# Patient Record
Sex: Female | Born: 1970 | Race: White | Hispanic: No | Smoking: Current every day smoker
Health system: Southern US, Community
[De-identification: ages and names within clinical notes are randomized; demographics above are authoritative.]

---

## 2006-03-27 ENCOUNTER — Ambulatory Visit: Payer: Self-pay | Admitting: Family Medicine

## 2007-03-09 ENCOUNTER — Emergency Department: Payer: Self-pay | Admitting: General Practice

## 2007-03-14 ENCOUNTER — Other Ambulatory Visit: Payer: Self-pay

## 2007-03-14 ENCOUNTER — Emergency Department: Payer: Self-pay

## 2007-03-16 ENCOUNTER — Emergency Department: Payer: Self-pay | Admitting: Unknown Physician Specialty

## 2010-03-12 ENCOUNTER — Emergency Department: Payer: Self-pay | Admitting: Emergency Medicine

## 2010-08-08 ENCOUNTER — Ambulatory Visit: Payer: Self-pay | Admitting: Obstetrics & Gynecology

## 2012-01-02 ENCOUNTER — Emergency Department: Payer: Self-pay | Admitting: Unknown Physician Specialty

## 2013-02-01 ENCOUNTER — Emergency Department: Payer: Self-pay | Admitting: Emergency Medicine

## 2013-02-01 LAB — URINALYSIS, COMPLETE
Blood: NEGATIVE
Glucose,UR: NEGATIVE mg/dL (ref 0–75)
Leukocyte Esterase: NEGATIVE
Nitrite: NEGATIVE
Ph: 5 (ref 4.5–8.0)
Specific Gravity: 1.029 (ref 1.003–1.030)
Squamous Epithelial: 9

## 2013-02-01 LAB — CBC
MCH: 33 pg (ref 26.0–34.0)
MCV: 99 fL (ref 80–100)

## 2013-02-01 LAB — RAPID INFLUENZA A&B ANTIGENS

## 2013-02-01 LAB — COMPREHENSIVE METABOLIC PANEL
Albumin: 3.8 g/dL (ref 3.4–5.0)
Alkaline Phosphatase: 98 U/L (ref 50–136)
BUN: 8 mg/dL (ref 7–18)
Calcium, Total: 8.8 mg/dL (ref 8.5–10.1)
Chloride: 106 mmol/L (ref 98–107)
Creatinine: 0.7 mg/dL (ref 0.60–1.30)
EGFR (African American): >60
Osmolality: 275 (ref 275–301)
SGOT(AST): 37 U/L (ref 15–37)
Sodium: 138 mmol/L (ref 136–145)
Total Protein: 7.9 g/dL (ref 6.4–8.2)

## 2013-10-19 ENCOUNTER — Emergency Department: Payer: Self-pay | Admitting: Emergency Medicine

## 2013-10-19 LAB — URINALYSIS, COMPLETE
Bacteria: NONE SEEN
Bilirubin,UR: NEGATIVE
Glucose,UR: NEGATIVE mg/dL (ref 0–75)
Leukocyte Esterase: NEGATIVE
Nitrite: NEGATIVE
Ph: 5 (ref 4.5–8.0)
Protein: NEGATIVE
RBC,UR: 1 /HPF (ref 0–5)
WBC UR: 2 /HPF (ref 0–5)

## 2013-10-19 LAB — CBC WITH DIFFERENTIAL/PLATELET
Basophil #: 0.1 10*3/uL (ref 0.0–0.1)
Basophil %: 1.2 %
Eosinophil %: 1.3 %
HGB: 15 g/dL (ref 12.0–16.0)
Lymphocyte #: 3.3 10*3/uL (ref 1.0–3.6)
Lymphocyte %: 29.3 %
MCH: 33.3 pg (ref 26.0–34.0)
MCV: 95 fL (ref 80–100)
Monocyte #: 0.7 x10 3/mm (ref 0.2–0.9)
Neutrophil %: 62.2 %
Platelet: 329 10*3/uL (ref 150–440)
RDW: 13.6 % (ref 11.5–14.5)
WBC: 11.1 10*3/uL — ABNORMAL HIGH (ref 3.6–11.0)

## 2013-10-19 LAB — BASIC METABOLIC PANEL
BUN: 7 mg/dL (ref 7–18)
Chloride: 108 mmol/L — ABNORMAL HIGH (ref 98–107)
Co2: 23 mmol/L (ref 21–32)
Creatinine: 0.79 mg/dL (ref 0.60–1.30)
EGFR (African American): >60
Glucose: 92 mg/dL (ref 65–99)
Potassium: 3.4 mmol/L — ABNORMAL LOW (ref 3.5–5.1)
Sodium: 138 mmol/L (ref 136–145)

## 2014-04-15 ENCOUNTER — Emergency Department: Payer: Self-pay | Admitting: Emergency Medicine

## 2014-09-29 ENCOUNTER — Emergency Department: Payer: Self-pay | Admitting: Emergency Medicine

## 2014-10-03 LAB — WOUND AEROBIC CULTURE

## 2014-11-30 ENCOUNTER — Emergency Department: Payer: Self-pay | Admitting: Emergency Medicine

## 2014-11-30 LAB — URINALYSIS, COMPLETE
Bilirubin,UR: NEGATIVE
Blood: NEGATIVE
GLUCOSE, UR: NEGATIVE mg/dL (ref 0–75)
Ketone: NEGATIVE
Leukocyte Esterase: NEGATIVE
Nitrite: NEGATIVE
PROTEIN: NEGATIVE
Ph: 7 (ref 4.5–8.0)
RBC,UR: 2 /HPF (ref 0–5)
Specific Gravity: 1.02 (ref 1.003–1.030)
Squamous Epithelial: <1

## 2014-11-30 LAB — BASIC METABOLIC PANEL
ANION GAP: 8 (ref 7–16)
BUN: 7 mg/dL (ref 7–18)
CO2: 23 mmol/L (ref 21–32)
CREATININE: 0.98 mg/dL (ref 0.60–1.30)
Calcium, Total: 8.3 mg/dL — ABNORMAL LOW (ref 8.5–10.1)
Chloride: 105 mmol/L (ref 98–107)
EGFR (African American): >60
GLUCOSE: 106 mg/dL — AB (ref 65–99)
OSMOLALITY: 270 (ref 275–301)
Potassium: 3.1 mmol/L — ABNORMAL LOW (ref 3.5–5.1)
Sodium: 136 mmol/L (ref 136–145)

## 2014-11-30 LAB — CBC WITH DIFFERENTIAL/PLATELET
BASOS PCT: 1 %
Basophil #: 0.1 10*3/uL (ref 0.0–0.1)
EOS PCT: 1.4 %
Eosinophil #: 0.2 10*3/uL (ref 0.0–0.7)
HCT: 44.4 % (ref 35.0–47.0)
HGB: 14.6 g/dL (ref 12.0–16.0)
Lymphocyte #: 4 10*3/uL — ABNORMAL HIGH (ref 1.0–3.6)
Lymphocyte %: 33.3 %
MCH: 32.2 pg (ref 26.0–34.0)
MCHC: 32.9 g/dL (ref 32.0–36.0)
MCV: 98 fL (ref 80–100)
Monocyte #: 0.7 x10 3/mm (ref 0.2–0.9)
Monocyte %: 5.9 %
NEUTROS ABS: 7 10*3/uL — AB (ref 1.4–6.5)
NEUTROS PCT: 58.4 %
PLATELETS: 301 10*3/uL (ref 150–440)
RBC: 4.55 10*6/uL (ref 3.80–5.20)
RDW: 14.7 % — ABNORMAL HIGH (ref 11.5–14.5)
WBC: 12.1 10*3/uL — AB (ref 3.6–11.0)

## 2014-11-30 LAB — PREGNANCY, URINE: Pregnancy Test, Urine: NEGATIVE m[IU]/mL

## 2017-08-21 ENCOUNTER — Emergency Department
Admission: EM | Admit: 2017-08-21 | Discharge: 2017-08-21 | Disposition: A | Discharge status: Home / Self Care | Payer: BLUE CROSS/BLUE SHIELD | Attending: Emergency Medicine | Admitting: Emergency Medicine

## 2017-08-21 DIAGNOSIS — F17210 Nicotine dependence, cigarettes, uncomplicated: Secondary | ICD-10-CM | POA: Diagnosis not present

## 2017-08-21 DIAGNOSIS — R600 Localized edema: Secondary | ICD-10-CM | POA: Diagnosis present

## 2017-08-21 DIAGNOSIS — L03211 Cellulitis of face: Secondary | ICD-10-CM | POA: Diagnosis not present

## 2017-08-21 MED ORDER — CLINDAMYCIN HCL 300 MG PO CAPS: 300.0000 mg | ORAL_CAPSULE | Freq: Four times a day (QID) | ORAL | 0 refills | Status: AC

## 2017-08-21 MED ORDER — IBUPROFEN 800 MG PO TABS: 800.0000 mg | ORAL_TABLET | Freq: Three times a day (TID) | ORAL | 0 refills | Status: DC | PRN

## 2017-08-21 MED ORDER — IBUPROFEN 800 MG PO TABS
800.0000 mg | ORAL_TABLET | Freq: Once | ORAL | Status: AC
  Administered 2017-08-21: 800 mg via ORAL
  Filled 2017-08-21: qty 1

## 2017-08-21 MED ORDER — IBUPROFEN 800 MG PO TABS: 800.0000 mg | ORAL_TABLET | Freq: Once | ORAL | Status: DC

## 2017-08-21 MED ORDER — CLINDAMYCIN PHOSPHATE 300 MG/2ML IJ SOLN
INTRAMUSCULAR | Status: AC
  Filled 2017-08-21: qty 4

## 2017-08-21 MED ORDER — CLINDAMYCIN PHOSPHATE 600 MG/4ML IJ SOLN
600.0000 mg | Freq: Once | INTRAMUSCULAR | Status: AC
  Administered 2017-08-21: 600 mg via INTRAMUSCULAR
  Filled 2017-08-21: qty 4

## 2017-08-21 NOTE — ED Triage Notes (Signed)
Patient c/o abscess to right lower face.

## 2017-08-21 NOTE — Discharge Instructions (Signed)
Take prescription as directed. Monitor the infection area for signs of improvement. If you note worsening signs of infection do not hesitate to return to the emergency department.

## 2017-08-21 NOTE — ED Provider Notes (Signed)
[facility] Emergency Department Provider Note   ____________________________________________   I have reviewed the triage vital signs and the nursing notes.   HISTORY  Chief Complaint Abscess    HPI Faith Price is a 46 y.o. female presents to the emergency department with a skin infection along the right lower jawline that developed yesterday. Patient noted the area started as what she thought was a pimple and she attempted to pop it. Patient noted progressing swelling through the day with increasing pain. Patient denies dental pain associated with soft tissue swelling and pain. Patient denies fever, chills, headache, vision changes, chest pain, chest tightness, shortness of breath, abdominal pain, nausea and vomiting.  History reviewed. No pertinent past medical history.  There are no active problems to display for this patient.   No past surgical history on file.  Prior to Admission medications   Medication Sig Start Date End Date Taking? Authorizing Provider  Citalopram Hydrobromide (CELEXA PO) Take by mouth.   Yes [provider]  clindamycin (CLEOCIN) 300 MG capsule Take 1 capsule (300 mg total) by mouth 4 (four) times daily. 08/21/17 08/31/17  Zury Fazzino M, PA-C  ibuprofen (ADVIL,MOTRIN) 800 MG tablet Take 1 tablet (800 mg total) by mouth every 8 (eight) hours as needed for moderate pain. 08/21/17   Joselyn Edling M, PA-C    Allergies Patient has no known allergies.  No family history on file.  Social History Social History  Substance Use Topics  . Smoking status: Current Every Day Smoker    Packs/day: 2.00    Types: Cigarettes  . Smokeless tobacco: Never Used  . Alcohol use No    Review of Systems Constitutional: Negative for fever/chills Eyes: No visual changes. ENT:  Negative for sore throat and for difficulty swallowing Cardiovascular: Denies chest pain. Respiratory: Denies cough. Denies shortness of  breath. Gastrointestinal: No abdominal pain.  No nausea, vomiting, diarrhea. Genitourinary: Negative for dysuria. Musculoskeletal: Negative for back pain. Skin: Negative for rash. Right lower jaw swelling, erythema and pain. Neurological: Negative for headaches.   ____________________________________________   PHYSICAL EXAM:  VITAL SIGNS: ED Triage Vitals  Enc Vitals Group     BP 08/21/17 1934 136/83     Pulse Rate 08/21/17 1933 76     Resp 08/21/17 1933 17     Temp 08/21/17 1933 97.7 F (36.5 C)     Temp Source 08/21/17 1933 Oral     SpO2 08/21/17 1933 100 %     Weight 08/21/17 1933 150 lb (68 kg)     Height 08/21/17 1933 5' 6.5" (1.689 m)     Head Circumference --      Peak Flow --      Pain Score 08/21/17 1932 7     Pain Loc --      Pain Edu? --      Excl. in GC? --     Constitutional: Alert and oriented. Well appearing and in no acute distress.  Eyes: Conjunctivae are normal. PERRL. EOMI  Head: Normocephalic and atraumatic. ENT:      Ears: Canals clear. TMs intact bilaterally.      Nose: No congestion/rhinnorhea.      Mouth/Throat: Mucous membranes are moist. Oropharynx Nonerythematous or edematous. Tonsils bilaterally symmetrical. Uvula midline. Neck:Supple. No thyromegaly. No stridor.  Cardiovascular: Normal rate, regular rhythm. Normal S1 and S2.  Good peripheral circulation. Respiratory: Normal respiratory effort without tachypnea or retractions. Lungs CTAB. No wheezes/rales/rhonchi.  Hematological/Lymphatic/Immunological: No cervical lymphadenopathy. Cardiovascular: Normal rate,  regular rhythm. Normal distal pulses. Gastrointestinal: Bowel sounds 4 quadrants. Soft and nontender to palpation. No guarding or rigidity. No palpable masses. No distention. No CVA tenderness. Musculoskeletal: Nontender with normal range of motion in all extremities. Neurologic: Normal speech and language. Skin:  Skin is warm, dry and intact. No rash noted. Cellulitis along the right  jawline with erythema, induration without area of fluctuance approximate size 3.5 x 3.5 cm. Psychiatric: Mood and affect are normal. Speech and behavior are normal. Patient exhibits appropriate insight and judgement.  ____________________________________________   LABS (all labs ordered are listed, but only abnormal results are displayed)  Labs Reviewed - No data to display ____________________________________________  EKG None ____________________________________________  RADIOLOGY None ____________________________________________   PROCEDURES  Procedure(s) performed: No    Critical Care performed: no ____________________________________________   INITIAL IMPRESSION / ASSESSMENT AND PLAN / ED COURSE  Pertinent labs & imaging results that were available during my care of the patient were reviewed by me and considered in my medical decision making (see chart for details).  Patient presents to emergency department with area along right lower jawline with erythema, swelling and pain that developed yesterday.Marland Kitchen History and physical exam findings are reassuring symptoms are consistent with cellulitis. There is no area of fluctuance therefore no incision and drainage required at this time. Antibody except initiated during the course of care the emergency department, clindamycin 600 mg IM. Patient will be prescribed a course of clindamycin and ibuprofen for pain and inflammation. Recommended patient continue to monitor the area for signs of worsening infection. If she noted worsening infection do not hesitate to return to the emergency department otherwise follow-up with her PCP as needed. Patient informed of clinical course, understand medical decision-making process, and agree with plan.   ____________________________________________   FINAL CLINICAL IMPRESSION(S) / ED DIAGNOSES  Final diagnoses:  Cellulitis, face       NEW MEDICATIONS STARTED DURING THIS  VISIT:  Discharge Medication List as of 08/21/2017  8:13 PM    START taking these medications   Details  clindamycin (CLEOCIN) 300 MG capsule Take 1 capsule (300 mg total) by mouth 4 (four) times daily., Starting Thu 08/21/2017, Until Sun 08/31/2017, Print    ibuprofen (ADVIL,MOTRIN) 800 MG tablet Take 1 tablet (800 mg total) by mouth every 8 (eight) hours as needed for moderate pain., Starting Thu 08/21/2017, Print         Note:  This document was prepared using Dragon voice recognition software and may include unintentional dictation errors.    Bertil Brickey, Karl Pock 08/21/17 2116    Minna Antis, MD 08/21/17 [phone]

## 2017-10-13 ENCOUNTER — Encounter: Payer: Self-pay | Admitting: Emergency Medicine

## 2017-10-13 DIAGNOSIS — R112 Nausea with vomiting, unspecified: Secondary | ICD-10-CM | POA: Insufficient documentation

## 2017-10-13 DIAGNOSIS — R1030 Lower abdominal pain, unspecified: Secondary | ICD-10-CM | POA: Diagnosis present

## 2017-10-13 DIAGNOSIS — F17210 Nicotine dependence, cigarettes, uncomplicated: Secondary | ICD-10-CM | POA: Insufficient documentation

## 2017-10-13 DIAGNOSIS — K529 Noninfective gastroenteritis and colitis, unspecified: Secondary | ICD-10-CM | POA: Diagnosis not present

## 2017-10-13 DIAGNOSIS — R509 Fever, unspecified: Secondary | ICD-10-CM | POA: Insufficient documentation

## 2017-10-13 LAB — COMPREHENSIVE METABOLIC PANEL
ALK PHOS: 60 U/L (ref 38–126)
ALT: 5 U/L — ABNORMAL LOW (ref 14–54)
ANION GAP: 7 (ref 5–15)
AST: 14 U/L — ABNORMAL LOW (ref 15–41)
Albumin: 3.7 g/dL (ref 3.5–5.0)
BILIRUBIN TOTAL: 0.4 mg/dL (ref 0.3–1.2)
BUN: 6 mg/dL (ref 6–20)
CALCIUM: 9 mg/dL (ref 8.9–10.3)
CO2: 26 mmol/L (ref 22–32)
Chloride: 105 mmol/L (ref 101–111)
Creatinine, Ser: 0.78 mg/dL (ref 0.44–1.00)
GFR calc non Af Amer: >60 mL/min (ref >60)
Glucose, Bld: 108 mg/dL — ABNORMAL HIGH (ref 65–99)
Potassium: 3.6 mmol/L (ref 3.5–5.1)
SODIUM: 138 mmol/L (ref 135–145)
TOTAL PROTEIN: 6.9 g/dL (ref 6.5–8.1)

## 2017-10-13 LAB — LIPASE, BLOOD: Lipase: 20 U/L (ref 11–51)

## 2017-10-13 LAB — CBC
HCT: 43.9 % (ref 35.0–47.0)
HEMOGLOBIN: 14.8 g/dL (ref 12.0–16.0)
MCH: 32.6 pg (ref 26.0–34.0)
MCHC: 33.8 g/dL (ref 32.0–36.0)
MCV: 96.4 fL (ref 80.0–100.0)
Platelets: 298 10*3/uL (ref 150–440)
RBC: 4.55 MIL/uL (ref 3.80–5.20)
RDW: 13.5 % (ref 11.5–14.5)
WBC: 15.6 10*3/uL — ABNORMAL HIGH (ref 3.6–11.0)

## 2017-10-13 NOTE — ED Triage Notes (Signed)
Pt c/o diarrhea, N/V x1 week. Pt denies recent antibiotic usage. Per pt, shje has not been able to eat nor drink due to the vomiting. Pts VS WNL in triage, steady gait seen as pt ambulates.

## 2017-10-14 ENCOUNTER — Emergency Department: Payer: BLUE CROSS/BLUE SHIELD

## 2017-10-14 ENCOUNTER — Encounter: Payer: Self-pay | Admitting: Radiology

## 2017-10-14 ENCOUNTER — Emergency Department
Admission: EM | Admit: 2017-10-14 | Discharge: 2017-10-14 | Disposition: A | Discharge status: Home / Self Care | Payer: BLUE CROSS/BLUE SHIELD | Attending: Emergency Medicine | Admitting: Emergency Medicine

## 2017-10-14 DIAGNOSIS — R112 Nausea with vomiting, unspecified: Secondary | ICD-10-CM

## 2017-10-14 DIAGNOSIS — R1030 Lower abdominal pain, unspecified: Secondary | ICD-10-CM

## 2017-10-14 DIAGNOSIS — R197 Diarrhea, unspecified: Secondary | ICD-10-CM

## 2017-10-14 DIAGNOSIS — K529 Noninfective gastroenteritis and colitis, unspecified: Secondary | ICD-10-CM

## 2017-10-14 LAB — URINALYSIS, COMPLETE (UACMP) WITH MICROSCOPIC
BILIRUBIN URINE: NEGATIVE
Glucose, UA: NEGATIVE mg/dL
Hgb urine dipstick: NEGATIVE
KETONES UR: NEGATIVE mg/dL
LEUKOCYTES UA: NEGATIVE
Nitrite: NEGATIVE
PH: 6 (ref 5.0–8.0)
Protein, ur: NEGATIVE mg/dL
Specific Gravity, Urine: 1.018 (ref 1.005–1.030)

## 2017-10-14 LAB — PREGNANCY, URINE: Preg Test, Ur: NEGATIVE

## 2017-10-14 MED ORDER — SODIUM CHLORIDE 0.9 % IV BOLUS (SEPSIS)
1000.0000 mL | Freq: Once | INTRAVENOUS | Status: AC
  Administered 2017-10-14: 1000 mL via INTRAVENOUS

## 2017-10-14 MED ORDER — ACETAMINOPHEN 325 MG PO TABS
ORAL_TABLET | ORAL | Status: AC
  Filled 2017-10-14: qty 2

## 2017-10-14 MED ORDER — ACETAMINOPHEN 325 MG PO TABS
650.0000 mg | ORAL_TABLET | Freq: Once | ORAL | Status: AC
  Administered 2017-10-14: 650 mg via ORAL

## 2017-10-14 MED ORDER — METRONIDAZOLE 500 MG PO TABS
500.0000 mg | ORAL_TABLET | Freq: Once | ORAL | Status: AC
  Administered 2017-10-14: 500 mg via ORAL
  Filled 2017-10-14: qty 1

## 2017-10-14 MED ORDER — METRONIDAZOLE 500 MG PO TABS: 500.0000 mg | ORAL_TABLET | Freq: Two times a day (BID) | ORAL | 0 refills | Status: DC

## 2017-10-14 MED ORDER — IOPAMIDOL (ISOVUE-300) INJECTION 61%
100.0000 mL | Freq: Once | INTRAVENOUS | Status: AC | PRN
  Administered 2017-10-14: 100 mL via INTRAVENOUS

## 2017-10-14 MED ORDER — CIPROFLOXACIN IN D5W 400 MG/200ML IV SOLN
400.0000 mg | Freq: Once | INTRAVENOUS | Status: AC
  Administered 2017-10-14: 400 mg via INTRAVENOUS
  Filled 2017-10-14: qty 200

## 2017-10-14 MED ORDER — CIPROFLOXACIN HCL 500 MG PO TABS: 500.0000 mg | ORAL_TABLET | Freq: Two times a day (BID) | ORAL | 0 refills | Status: DC

## 2017-10-14 MED ORDER — ONDANSETRON 4 MG PO TBDP: 4.0000 mg | ORAL_TABLET | Freq: Three times a day (TID) | ORAL | 0 refills | Status: DC | PRN

## 2017-10-14 MED ORDER — MORPHINE SULFATE (PF) 4 MG/ML IV SOLN
4.0000 mg | Freq: Once | INTRAVENOUS | Status: AC
  Administered 2017-10-14: 4 mg via INTRAVENOUS
  Filled 2017-10-14: qty 1

## 2017-10-14 MED ORDER — ONDANSETRON HCL 4 MG/2ML IJ SOLN
4.0000 mg | Freq: Once | INTRAMUSCULAR | Status: AC
  Administered 2017-10-14: 4 mg via INTRAVENOUS
  Filled 2017-10-14: qty 2

## 2017-10-14 NOTE — ED Provider Notes (Signed)
[facility] Emergency Department Provider Note   ____________________________________________   First MD Initiated Contact with Patient 10/14/17 0101     (approximate)  I have reviewed the triage vital signs and the nursing notes.   HISTORY  Chief Complaint Diarrhea; Emesis; and Fever    HPI Faith Price is a 46 y.o. female who comes into the hospital today with some abdominal pain, fever, nausea, vomiting and diarrhea. The patient is also had some back pain. She reports that she's been having diarrhea for the past week. Her other symptoms started Saturday. She's had some diarrhea that she has been unable to make it to the bathroom the last 2 times. The patient denies any sick contacts. She reports that her emesis has been clear and about 3 times a day. The patient denies recent antibiotics as well. She has some lower abdominal pain as well as pain with urination. The patient rates her pain 8 out of 10 in intensity. The patient reports that the pain is shooting and that her diarrhea comes every time she eats. The patient was feeling so unwell that her friend brought her in for further evaluation of her symptoms.   History reviewed. No pertinent past medical history.  There are no active problems to display for this patient.   History reviewed. No pertinent surgical history.  Prior to Admission medications   Medication Sig Start Date End Date Taking? Authorizing Provider  ciprofloxacin (CIPRO) 500 MG tablet Take 1 tablet (500 mg total) by mouth 2 (two) times daily. 10/14/17 10/21/17  Rebecka Apley, MD  Citalopram Hydrobromide (CELEXA PO) Take by mouth.    [provider]  ibuprofen (ADVIL,MOTRIN) 800 MG tablet Take 1 tablet (800 mg total) by mouth every 8 (eight) hours as needed for moderate pain. 08/21/17   Little, Traci M, PA-C  metroNIDAZOLE (FLAGYL) 500 MG tablet Take 1 tablet (500 mg total) by mouth 2 (two) times daily. 10/14/17 10/21/17   Rebecka Apley, MD  ondansetron (ZOFRAN ODT) 4 MG disintegrating tablet Take 1 tablet (4 mg total) by mouth every 8 (eight) hours as needed for nausea or vomiting. 10/14/17   Rebecka Apley, MD    Allergies Patient has no known allergies.  History reviewed. No pertinent family history.  Social History Social History  Substance Use Topics  . Smoking status: Current Every Day Smoker    Packs/day: 2.00    Types: Cigarettes  . Smokeless tobacco: Never Used  . Alcohol use No    Review of Systems  Constitutional:  fever/chills Eyes: No visual changes. ENT: No sore throat. Cardiovascular: Denies chest pain. Respiratory: Denies shortness of breath. Gastrointestinal: abdominal pain.   nausea, vomiting.   diarrhea.  No constipation. Genitourinary: Negative for dysuria. Musculoskeletal: back pain. Skin: Negative for rash. Neurological: Negative for headaches, focal weakness or numbness.   ____________________________________________   PHYSICAL EXAM:  VITAL SIGNS: ED Triage Vitals  Enc Vitals Group     BP 10/13/17 2205 (!) 108/92     Pulse Rate 10/13/17 2205 98     Resp 10/13/17 2205 18     Temp 10/13/17 2205 99.2 F (37.3 C)     Temp Source 10/13/17 2205 Oral     SpO2 10/13/17 2205 100 %     Weight 10/13/17 2205 150 lb (68 kg)     Height 10/13/17 2205  (1.676 m)     Head Circumference --      Peak Flow --  Pain Score 10/14/17 0104 8     Pain Loc --      Pain Edu? --      Excl. in GC? --     Constitutional: Alert and oriented. Well appearing and in moderate distress. Eyes: Conjunctivae are normal. PERRL. EOMI. Head: Atraumatic. Nose: No congestion/rhinnorhea. Mouth/Throat: Mucous membranes are moist.  Oropharynx non-erythematous. Cardiovascular: Normal rate, regular rhythm. Grossly normal heart sounds.  Good peripheral circulation. Respiratory: Normal respiratory effort.  No retractions. Lungs CTAB. Gastrointestinal: Soft with some lower  abdominal tenderness to palpation No distention. positive bowel sounds Musculoskeletal: No lower extremity tenderness nor edema.   Neurologic:  Normal speech and language.  Skin:  Skin is warm, dry and intact.  Psychiatric: Mood and affect are normal.   ____________________________________________   LABS (all labs ordered are listed, but only abnormal results are displayed)  Labs Reviewed  COMPREHENSIVE METABOLIC PANEL - Abnormal; Notable for the following:       Result Value   Glucose, Bld 108 (*)    AST 14 (*)    ALT 5 (*)    All other components within normal limits  CBC - Abnormal; Notable for the following:    WBC 15.6 (*)    All other components within normal limits  URINALYSIS, COMPLETE (UACMP) WITH MICROSCOPIC - Abnormal; Notable for the following:    Color, Urine YELLOW (*)    APPearance CLEAR (*)    Bacteria, UA RARE (*)    Squamous Epithelial / LPF 0-5 (*)    All other components within normal limits  LIPASE, BLOOD  PREGNANCY, URINE   ____________________________________________  EKG  none ____________________________________________  RADIOLOGY  Ct Abdomen Pelvis W Contrast  Result Date: 10/14/2017 CLINICAL DATA:  Diarrhea x1 week with nausea vomiting EXAM: CT ABDOMEN AND PELVIS WITH CONTRAST TECHNIQUE: Multidetector CT imaging of the abdomen and pelvis was performed using the standard protocol following bolus administration of intravenous contrast. CONTRAST:  ISOVUE-300 IOPAMIDOL (ISOVUE-300) INJECTION 61% COMPARISON:  Chest CT 03/14/2007 FINDINGS: Lower chest: Normal heart size. Minimal atelectasis at each lung base. No pericardial effusion. Hepatobiliary: 11 mm right hepatic hypervascular focus, nonspecific but could represent flash filling of a hemangioma. Normal gallbladder. No biliary dilatation. Pancreas: Normal pancreas. No inflammation or ductal dilatation. No focal mass. Spleen: Normal in size without focal abnormality. Adrenals/Urinary Tract:  Adrenal glands are unremarkable. Kidneys are normal, without renal calculi, focal lesion, or hydronephrosis. Bladder is unremarkable. Stomach/Bowel: Physiologic distention of the stomach with normal small bowel rotation. No mechanical obstruction. Mild transmural thickening of the colon from cecum through distal sigmoid consistent with a postinfectious or postinflammatory colitis. Normal appendix. Vascular/Lymphatic: Minimal aortic atherosclerosis. No lymphadenopathy. Reproductive: Uterus and bilateral adnexa are unremarkable. Other: Small umbilical fat containing hernia. No abdominopelvic ascites. Musculoskeletal: No acute or significant osseous findings. IMPRESSION: 1. Diffuse mild transmural thickening of the colon consistent with colitis. 2. Tiny right hepatic 11 mm hypervascular lesion, nonspecific but could represent flash filling of a small hemangioma. 3. Small fat containing umbilical hernia. Electronically Signed   By: Tollie Eth M.D.   On: 10/14/2017 03:39    ____________________________________________   PROCEDURES  Procedure(s) performed: None  Procedures  Critical Care performed: No  ____________________________________________   INITIAL IMPRESSION / ASSESSMENT AND PLAN / ED COURSE  As part of my medical decision making, I reviewed the following data within the electronic MEDICAL RECORD NUMBER Notes from prior ED visits and [place] Controlled Substance Database  this is a 46 year old female who comes into  the hospital today with abdominal pain fever nausea vomiting and diarrhea.  The differential diagnosis includes gastroenteritis, colitis, diverticulitisurinary tract infection.  I did check some blood work on the patient. Her lip also count was 15.6 but her remaining CBC was unremarkable. The patient's CMP was also unremarkable as was her urine. I gave the patient liter of normal saline and some morphine and Zofran. I sent the patient for CT scan and it was found that the patient had  some colitis. the patient received a dose of ciprofloxacin and Flagyl. She was able to take some oral medication without any vomiting as well as eat some crackers. The patient's pain was also improved. She'll be discharged home to follow-up with her primary care physician or the acute care clinic. The patient has no further questions or concerns.       ____________________________________________   FINAL CLINICAL IMPRESSION(S) / ED DIAGNOSES  Final diagnoses:  Nausea vomiting and diarrhea  Colitis  Lower abdominal pain      NEW MEDICATIONS STARTED DURING THIS VISIT:  Discharge Medication List as of 10/14/2017  5:03 AM    START taking these medications   Details  ciprofloxacin (CIPRO) 500 MG tablet Take 1 tablet (500 mg total) by mouth 2 (two) times daily., Starting Tue 10/14/2017, Until Tue 10/21/2017, Print    metroNIDAZOLE (FLAGYL) 500 MG tablet Take 1 tablet (500 mg total) by mouth 2 (two) times daily., Starting Tue 10/14/2017, Until Tue 10/21/2017, Print    ondansetron (ZOFRAN ODT) 4 MG disintegrating tablet Take 1 tablet (4 mg total) by mouth every 8 (eight) hours as needed for nausea or vomiting., Starting Tue 10/14/2017, Print         Note:  This document was prepared using Dragon voice recognition software and may include unintentional dictation errors.    Rebecka Apley, MD 10/14/17 [id number]

## 2017-10-14 NOTE — ED Notes (Signed)
ED Provider at bedside. 

## 2017-10-14 NOTE — Discharge Instructions (Signed)
PLease follow up with the acute care clinic

## 2017-10-14 NOTE — ED Notes (Addendum)
Pt states watery diarrhea x1 week, vomiting clear liquid and abd pain x2 days. Pt states unable to tolerate PO

## 2017-10-15 ENCOUNTER — Encounter: Payer: Self-pay | Admitting: Emergency Medicine

## 2017-10-15 ENCOUNTER — Emergency Department
Admission: EM | Admit: 2017-10-15 | Discharge: 2017-10-15 | Disposition: A | Discharge status: Home / Self Care | Payer: BLUE CROSS/BLUE SHIELD | Attending: Emergency Medicine | Admitting: Emergency Medicine

## 2017-10-15 DIAGNOSIS — A0472 Enterocolitis due to Clostridium difficile, not specified as recurrent: Secondary | ICD-10-CM | POA: Diagnosis not present

## 2017-10-15 DIAGNOSIS — Z79899 Other long term (current) drug therapy: Secondary | ICD-10-CM | POA: Diagnosis not present

## 2017-10-15 DIAGNOSIS — F17210 Nicotine dependence, cigarettes, uncomplicated: Secondary | ICD-10-CM | POA: Insufficient documentation

## 2017-10-15 DIAGNOSIS — R197 Diarrhea, unspecified: Secondary | ICD-10-CM | POA: Diagnosis present

## 2017-10-15 LAB — URINALYSIS, COMPLETE (UACMP) WITH MICROSCOPIC
BACTERIA UA: NONE SEEN
BILIRUBIN URINE: NEGATIVE
Glucose, UA: NEGATIVE mg/dL
Hgb urine dipstick: NEGATIVE
KETONES UR: NEGATIVE mg/dL
Nitrite: NEGATIVE
Protein, ur: NEGATIVE mg/dL
RBC / HPF: NONE SEEN RBC/hpf (ref 0–5)
Specific Gravity, Urine: 1.016 (ref 1.005–1.030)
pH: 5 (ref 5.0–8.0)

## 2017-10-15 LAB — C DIFFICILE QUICK SCREEN W PCR REFLEX
C DIFFICILE (CDIFF) TOXIN: POSITIVE — AB
C DIFFICLE (CDIFF) ANTIGEN: POSITIVE — AB
C Diff interpretation: DETECTED

## 2017-10-15 LAB — CBC
HEMATOCRIT: 43.1 % (ref 35.0–47.0)
Hemoglobin: 14.7 g/dL (ref 12.0–16.0)
MCH: 32.5 pg (ref 26.0–34.0)
MCHC: 34 g/dL (ref 32.0–36.0)
MCV: 95.5 fL (ref 80.0–100.0)
Platelets: 322 10*3/uL (ref 150–440)
RBC: 4.51 MIL/uL (ref 3.80–5.20)
RDW: 13.4 % (ref 11.5–14.5)
WBC: 14.2 10*3/uL — AB (ref 3.6–11.0)

## 2017-10-15 LAB — COMPREHENSIVE METABOLIC PANEL
ALT: 7 U/L — ABNORMAL LOW (ref 14–54)
AST: 16 U/L (ref 15–41)
Albumin: 3.6 g/dL (ref 3.5–5.0)
Alkaline Phosphatase: 62 U/L (ref 38–126)
Anion gap: 10 (ref 5–15)
BUN: 8 mg/dL (ref 6–20)
CHLORIDE: 102 mmol/L (ref 101–111)
CO2: 26 mmol/L (ref 22–32)
Calcium: 8.8 mg/dL — ABNORMAL LOW (ref 8.9–10.3)
Creatinine, Ser: 0.86 mg/dL (ref 0.44–1.00)
Glucose, Bld: 100 mg/dL — ABNORMAL HIGH (ref 65–99)
POTASSIUM: 3.4 mmol/L — AB (ref 3.5–5.1)
Sodium: 138 mmol/L (ref 135–145)
Total Bilirubin: 0.3 mg/dL (ref 0.3–1.2)
Total Protein: 6.9 g/dL (ref 6.5–8.1)

## 2017-10-15 LAB — LIPASE, BLOOD: LIPASE: 19 U/L (ref 11–51)

## 2017-10-15 MED ORDER — SODIUM CHLORIDE 0.9 % IV BOLUS (SEPSIS)
1000.0000 mL | Freq: Once | INTRAVENOUS | Status: AC
  Administered 2017-10-15: 1000 mL via INTRAVENOUS

## 2017-10-15 MED ORDER — SODIUM CHLORIDE 0.9 % IV BOLUS (SEPSIS)
1000.0000 mL | Freq: Once | INTRAVENOUS | Status: AC
Start: 2017-10-15 — End: 2017-10-15
  Administered 2017-10-15: 1000 mL via INTRAVENOUS

## 2017-10-15 MED ORDER — OXYCODONE-ACETAMINOPHEN 5-325 MG PO TABS: 1.0000 | ORAL_TABLET | Freq: Four times a day (QID) | ORAL | 0 refills | Status: AC | PRN

## 2017-10-15 MED ORDER — VANCOMYCIN 50 MG/ML ORAL SOLUTION
125.0000 mg | Freq: Four times a day (QID) | ORAL | Status: DC
  Administered 2017-10-15: 125 mg via ORAL
  Filled 2017-10-15 (×2): qty 2.5

## 2017-10-15 MED ORDER — KETOROLAC TROMETHAMINE 30 MG/ML IJ SOLN
15.0000 mg | Freq: Once | INTRAMUSCULAR | Status: AC
  Administered 2017-10-15: 15 mg via INTRAVENOUS
  Filled 2017-10-15: qty 1

## 2017-10-15 MED ORDER — ONDANSETRON HCL 4 MG/2ML IJ SOLN
INTRAMUSCULAR | Status: AC
  Administered 2017-10-15: 12:00:00
  Filled 2017-10-15: qty 2

## 2017-10-15 MED ORDER — VANCOMYCIN HCL 125 MG PO CAPS: 125.0000 mg | ORAL_CAPSULE | Freq: Four times a day (QID) | ORAL | 0 refills | Status: AC

## 2017-10-15 MED ORDER — ONDANSETRON HCL 4 MG/2ML IJ SOLN
4.0000 mg | Freq: Once | INTRAMUSCULAR | Status: AC
  Administered 2017-10-15: 4 mg via INTRAVENOUS

## 2017-10-15 MED ORDER — ACETAMINOPHEN 500 MG PO TABS
1000.0000 mg | ORAL_TABLET | Freq: Once | ORAL | Status: AC
Start: 2017-10-15 — End: 2017-10-15
  Administered 2017-10-15: 1000 mg via ORAL
  Filled 2017-10-15: qty 2

## 2017-10-15 NOTE — ED Notes (Signed)
ED Provider at bedside. 

## 2017-10-15 NOTE — ED Triage Notes (Signed)
Pt to ED via POV with c/o abd pain. Pt was seen on 10/16 and discharged with colitis and given flagyl and cipro rx. Pt states worsening pain and n/v/d.

## 2017-10-15 NOTE — ED Notes (Signed)
Spoke with MD Don PerkingVeronese regarding pt symptoms , verbal orders given for zofran and fluids at this time

## 2017-10-15 NOTE — ED Provider Notes (Signed)
[facility] Emergency Department Provider Note  ____________________________________________  Time seen: Approximately 11:57 AM  I have reviewed the triage vital signs and the nursing notes.   HISTORY  Chief Complaint Abdominal Pain   HPI Faith Price is a 46 y.o. female who was seen here 2 days ago and diagnosed with colitis on the CT scan who presents for evaluation of persistent abdominal pain, nausea, and diarrhea. Patient has been takingCipro and Flagyl. She reports that she continues to have several daily watery bowel movements and nausea. Not vomiting anymore. Has had chills and subjective fevers. Continues to endorse diffuse, cramping, moderate severe abdominal pain. No dysuria or hematuria. No chest pain or shortness of breath. No prior history of C. difficile. Patient reports that her pain is identical to the patient had 2 days ago when she underwent a CT scan.  History reviewed. No pertinent past medical history.  There are no active problems to display for this patient.   History reviewed. No pertinent surgical history.  Prior to Admission medications   Medication Sig Start Date End Date Taking? Authorizing Provider  Citalopram Hydrobromide (CELEXA PO) Take by mouth.    [provider]  ibuprofen (ADVIL,MOTRIN) 800 MG tablet Take 1 tablet (800 mg total) by mouth every 8 (eight) hours as needed for moderate pain. 08/21/17   Little, Traci M, PA-C  ondansetron (ZOFRAN ODT) 4 MG disintegrating tablet Take 1 tablet (4 mg total) by mouth every 8 (eight) hours as needed for nausea or vomiting. 10/14/17   Rebecka ApleyWebster, Allison P, MD  oxyCODONE-acetaminophen (ROXICET) 5-325 MG tablet Take 1 tablet by mouth every 6 (six) hours as needed. 10/15/17 10/15/18  Nita SickleVeronese, [place], MD  vancomycin (VANCOCIN) 125 MG capsule Take 1 capsule (125 mg total) by mouth 4 (four) times daily. 10/15/17 10/25/17  Nita SickleVeronese, [place], MD    Allergies Patient has no  known allergies.  No family history on file.  Social History Social History  Substance Use Topics  . Smoking status: Current Every Day Smoker    Packs/day: 2.00    Types: Cigarettes  . Smokeless tobacco: Never Used  . Alcohol use No    Review of Systems  Constitutional: + fever and chills Eyes: Negative for visual changes. ENT: Negative for sore throat. Neck: No neck pain  Cardiovascular: Negative for chest pain. Respiratory: Negative for shortness of breath. Gastrointestinal: + diffuse abdominal pain, nausea, and diarrhea. No vomiting  Genitourinary: Negative for dysuria. Musculoskeletal: Negative for back pain. Skin: Negative for rash. Neurological: Negative for headaches, weakness or numbness. Psych: No SI or HI  ____________________________________________   PHYSICAL EXAM:  VITAL SIGNS: ED Triage Vitals  Enc Vitals Group     BP 10/15/17 1030 (!) 118/94     Pulse Rate 10/15/17 1030 (!) 122     Resp 10/15/17 1030 18     Temp 10/15/17 1030 98.4 F (36.9 C)     Temp Source 10/15/17 1030 Oral     SpO2 10/15/17 1030 99 %     Weight 10/15/17 1031 150 lb (68 kg)     Height 10/15/17 1031 5\' 6"  (1.676 m)     Head Circumference --      Peak Flow --      Pain Score 10/15/17 1048 10     Pain Loc --      Pain Edu? --      Excl. in GC? --     Constitutional: Alert and oriented. Well appearing and in no  apparent distress. HEENT:      Head: Normocephalic and atraumatic.         Eyes: Conjunctivae are normal. Sclera is non-icteric.       Mouth/Throat: Mucous membranes are dry.       Neck: Supple with no signs of meningismus. Cardiovascular: Tachycardic with regular rhythm. No murmurs, gallops, or rubs. 2+ symmetrical distal pulses are present in all extremities. No JVD. Respiratory: Normal respiratory effort. Lungs are clear to auscultation bilaterally. No wheezes, crackles, or rhonchi.  Gastrointestinal: Soft, mildly diffuse tender to palpation, and non distended with  positive bowel sounds. No rebound or guarding. Musculoskeletal: Nontender with normal range of motion in all extremities. No edema, cyanosis, or erythema of extremities. Neurologic: Normal speech and language. Face is symmetric. Moving all extremities. No gross focal neurologic deficits are appreciated. Skin: Skin is warm, dry and intact. No rash noted. Psychiatric: Mood and affect are normal. Speech and behavior are normal.  ____________________________________________   LABS (all labs ordered are listed, but only abnormal results are displayed)  Labs Reviewed  C DIFFICILE QUICK SCREEN W PCR REFLEX - Abnormal; Notable for the following:       Result Value   C Diff antigen POSITIVE (*)    C Diff toxin POSITIVE (*)    All other components within normal limits  COMPREHENSIVE METABOLIC PANEL - Abnormal; Notable for the following:    Potassium 3.4 (*)    Glucose, Bld 100 (*)    Calcium 8.8 (*)    ALT 7 (*)    All other components within normal limits  CBC - Abnormal; Notable for the following:    WBC 14.2 (*)    All other components within normal limits  URINALYSIS, COMPLETE (UACMP) WITH MICROSCOPIC - Abnormal; Notable for the following:    Color, Urine YELLOW (*)    APPearance HAZY (*)    Leukocytes, UA TRACE (*)    Squamous Epithelial / LPF 6-30 (*)    All other components within normal limits  LIPASE, BLOOD  POC URINE PREG, ED   ____________________________________________  EKG  none  ____________________________________________  RADIOLOGY  none  ____________________________________________   PROCEDURES  Procedure(s) performed: None Procedures Critical Care performed:  None ____________________________________________   INITIAL IMPRESSION / ASSESSMENT AND PLAN / ED COURSE  46 y.o. female who was seen here 2 days ago and diagnosed with colitis on the CT scan who presents for evaluation of persistent abdominal pain, nausea, and diarrhea. patient is tachycardic  and looks dry on exam, her abdomen is soft with no rebound or guarding, normal bowel sounds, and diffuse mild tenderness throughout. Labs show improving leukocytosis, normal creatinine. UA is pending to evaluate for ketones or infection. We'll order a C. difficile. We'll give IV fluids, IV Zofran, Toradol and Tylenol and reassess. Presentation concerning for ongoing colitis and dehydration in the setting of similar symptoms and CT done 2 days ago. No localized RLQ ttp.    _________________________ 1:49 PM on 10/15/2017 -----------------------------------------  C. difficile positive in the emergency department. The patient's blood pressure and heart rate improved after 2 L of normal saline. She is tolerating by mouth. Patient only had one small bowel movement in the emergency department. We'll discontinue Cipro and Flagyl and switch patient to by mouth vancomycin. Discussed return precautions and recommended close follow-up with primary care doctor.   As part of my medical decision making, I reviewed the following data within the electronic MEDICAL RECORD NUMBER Nursing notes reviewed and incorporated, Labs reviewed ,  Old chart reviewed, Notes from prior ED visits and [place] Controlled Substance Database    Pertinent labs & imaging results that were available during my care of the patient were reviewed by me and considered in my medical decision making (see chart for details).    ____________________________________________   FINAL CLINICAL IMPRESSION(S) / ED DIAGNOSES  Final diagnoses:  C. difficile colitis      NEW MEDICATIONS STARTED DURING THIS VISIT:  New Prescriptions   OXYCODONE-ACETAMINOPHEN (ROXICET) 5-325 MG TABLET    Take 1 tablet by mouth every 6 (six) hours as needed.   VANCOMYCIN (VANCOCIN) 125 MG CAPSULE    Take 1 capsule (125 mg total) by mouth 4 (four) times daily.     Note:  This document was prepared using Dragon voice recognition software and may include unintentional  dictation errors.    Nita Sickle, MD 10/15/17 1352

## 2018-06-19 IMAGING — CT CT ABD-PELV W/ CM
2 of 5 series · 16 of 46 positions shown, 18 images · IV contrast (APPLIED)
Comparison: Chest CT 03/14/2007

CLINICAL DATA: Diarrhea x1 week with nausea vomiting

EXAM:
CT ABDOMEN AND PELVIS WITH CONTRAST
TECHNIQUE: Multidetector CT imaging of the abdomen and pelvis was performed
using the standard protocol following bolus administration of
intravenous contrast.
CONTRAST:  100mL HANJF6-C11 IOPAMIDOL (HANJF6-C11) INJECTION 61%

[Series 2: routine abd/pel with · axial · 0.71mm/px · z∈[-996,-586]mm · 13 of 92 slices shown, 15 images]
[im 5/92  soft-tissue]
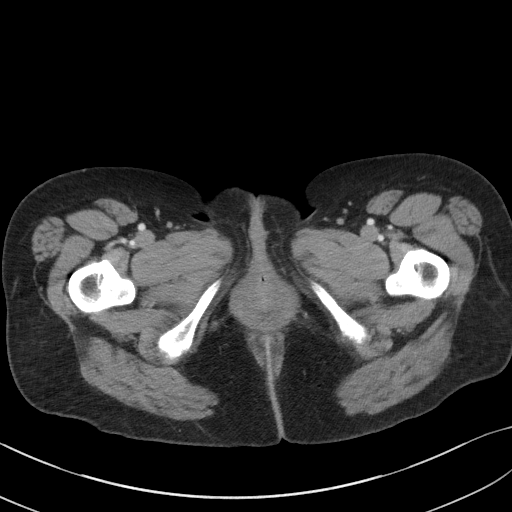
[im 5/92  bone]
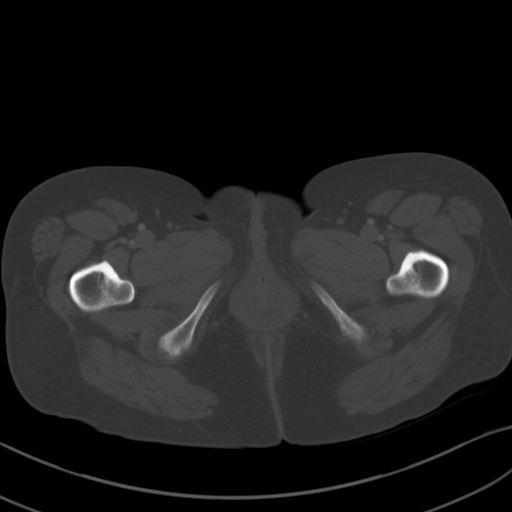
[im 15/92  soft-tissue]
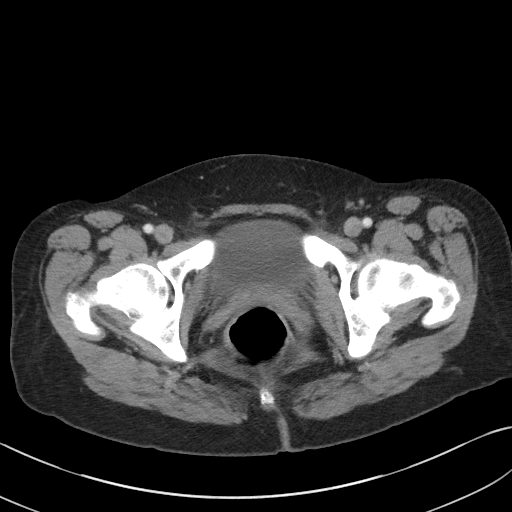
[im 20/92  soft-tissue]
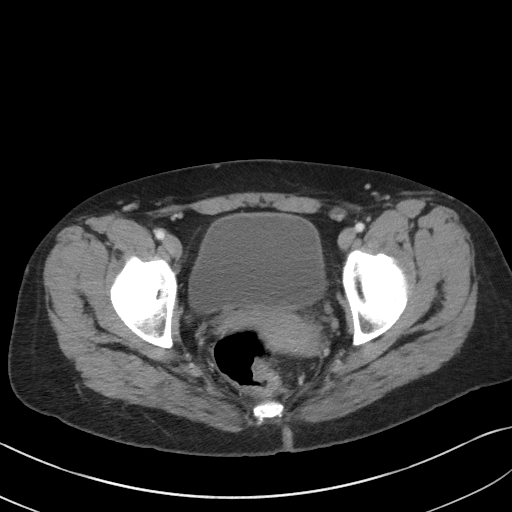
[im 24/92  soft-tissue]
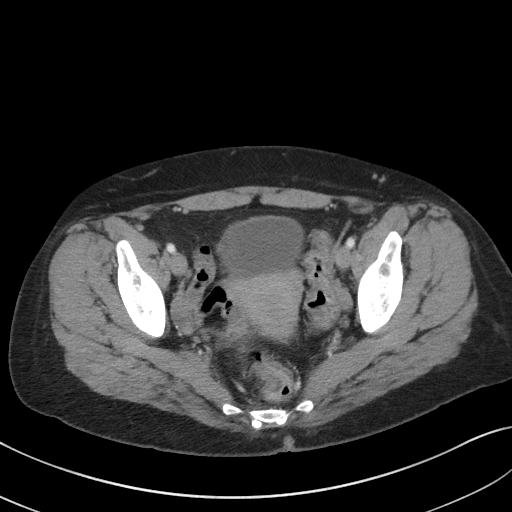
[im 34/92  soft-tissue]
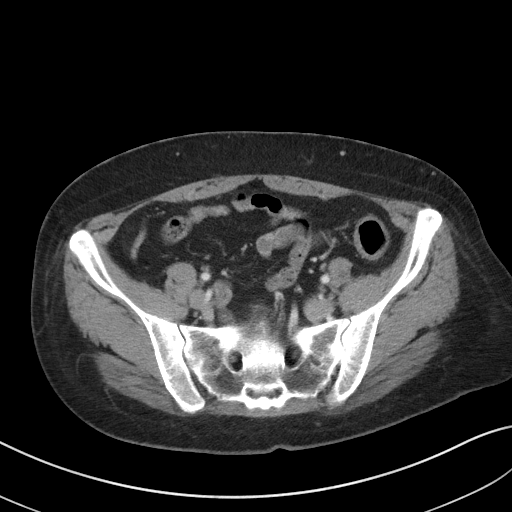
[im 39/92  soft-tissue]
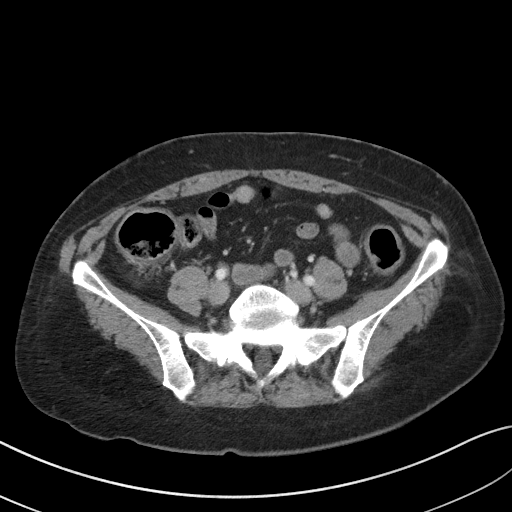
[im 48/92  soft-tissue]
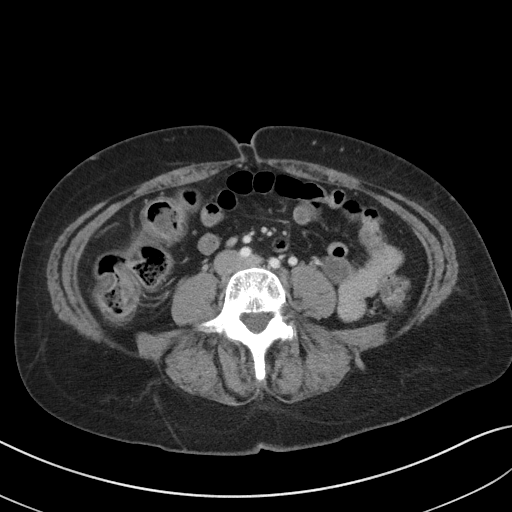
[im 53/92  soft-tissue]
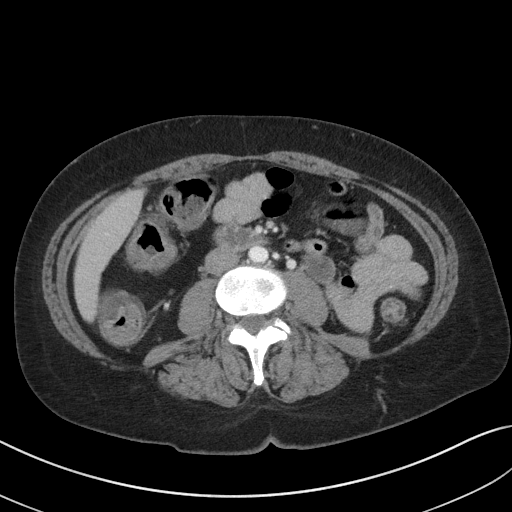
[im 58/92  soft-tissue]
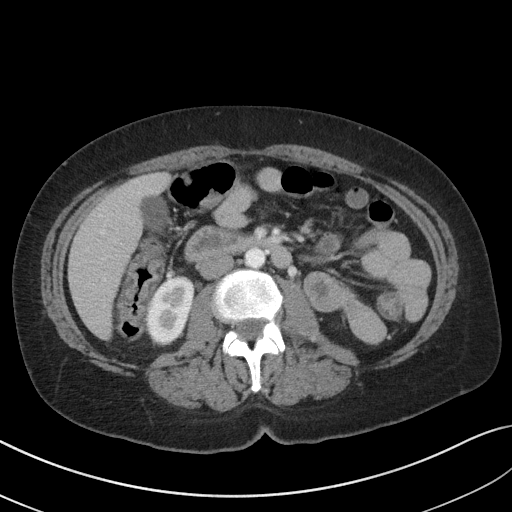
[im 58/92  bone]
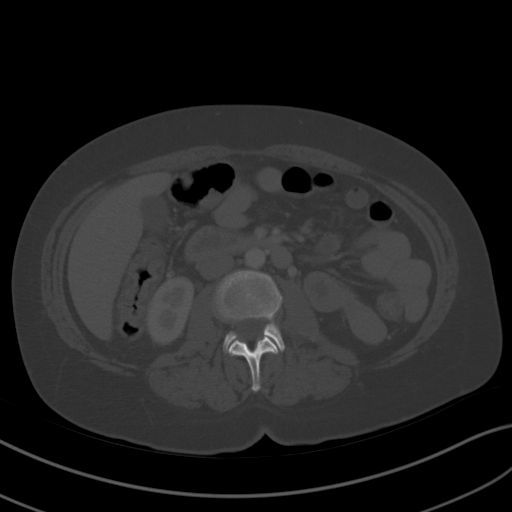
[im 68/92  soft-tissue]
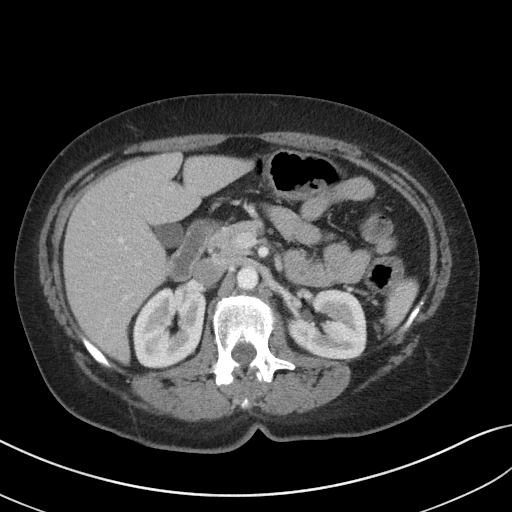
[im 72/92  soft-tissue]
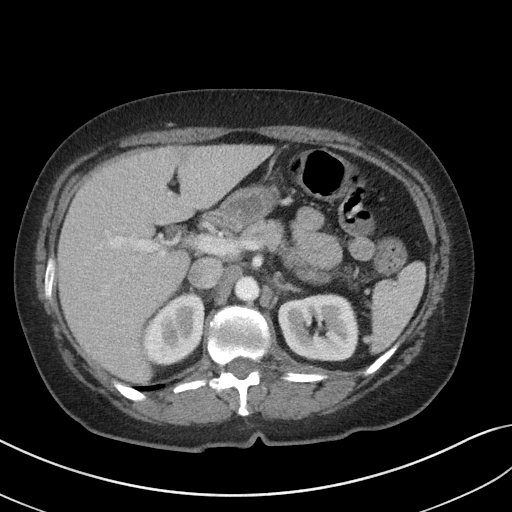
[im 77/92  soft-tissue]
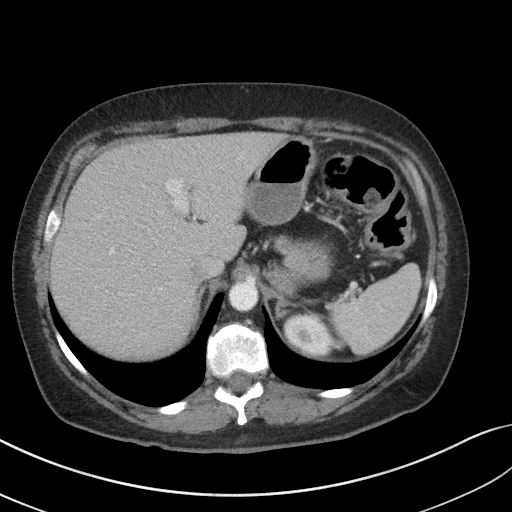
[im 87/92  soft-tissue]
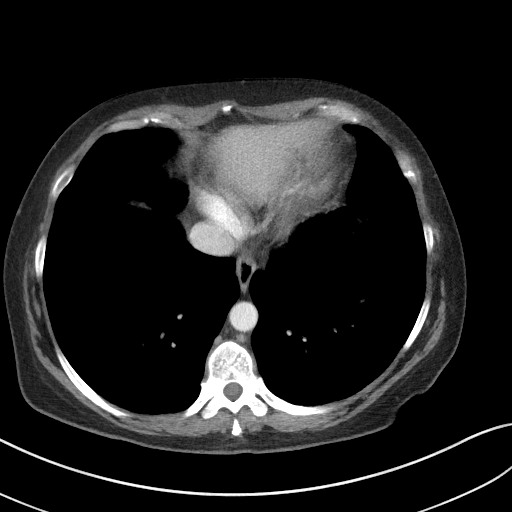

[Series 5: coronal st · coronal · 0.68mm/px · 3 of 87 slices shown]
[im 29/87  soft-tissue]
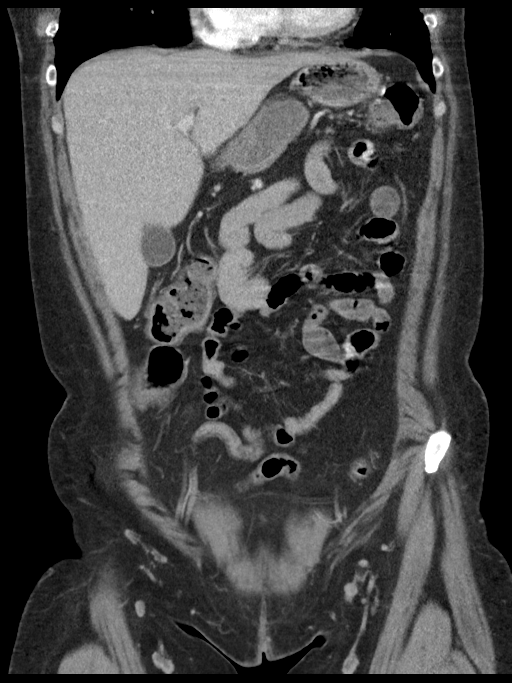
[im 39/87  soft-tissue]
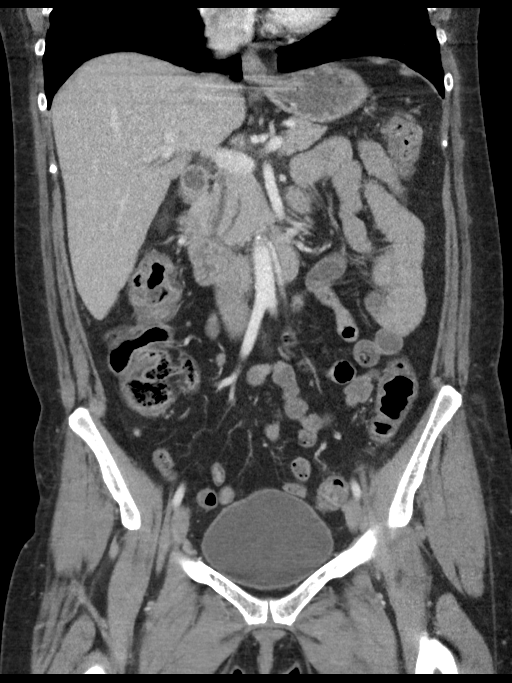
[im 48/87  soft-tissue]
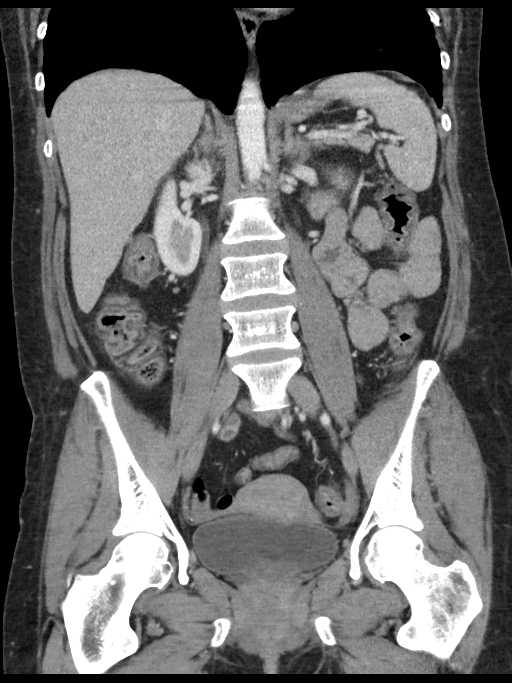

[16 of 46 positions shown; findings below may reference images not displayed]

FINDINGS: Lower chest: Normal heart size. Minimal atelectasis at each lung
base. No pericardial effusion.

Hepatobiliary: 11 mm right hepatic hypervascular focus, nonspecific
but could represent flash filling of a hemangioma. Normal
gallbladder. No biliary dilatation.

Pancreas: Normal pancreas. No inflammation or ductal dilatation. No
focal mass.

Spleen: Normal in size without focal abnormality.

Adrenals/Urinary Tract: Adrenal glands are unremarkable. Kidneys are
normal, without renal calculi, focal lesion, or hydronephrosis.
Bladder is unremarkable.

Stomach/Bowel: Physiologic distention of the stomach with normal
small bowel rotation. No mechanical obstruction. Mild transmural
thickening of the colon from cecum through distal sigmoid consistent
with a postinfectious or postinflammatory colitis. Normal appendix.

Vascular/Lymphatic: Minimal aortic atherosclerosis. No
lymphadenopathy.

Reproductive: Uterus and bilateral adnexa are unremarkable.

Other: Small umbilical fat containing hernia. No abdominopelvic
ascites.

Musculoskeletal: No acute or significant osseous findings.
IMPRESSION: 1. Diffuse mild transmural thickening of the colon consistent with
colitis.
2. Tiny right hepatic 11 mm hypervascular lesion, nonspecific but
could represent flash filling of a small hemangioma.
3. Small fat containing umbilical hernia.

## 2020-06-01 ENCOUNTER — Encounter: Payer: Self-pay | Admitting: *Deleted

## 2020-06-01 ENCOUNTER — Emergency Department
Admission: EM | Admit: 2020-06-01 | Discharge: 2020-06-01 | Disposition: A | Discharge status: Home / Self Care | Payer: BC Managed Care – PPO | Attending: Emergency Medicine | Admitting: Emergency Medicine

## 2020-06-01 ENCOUNTER — Other Ambulatory Visit: Payer: Self-pay

## 2020-06-01 DIAGNOSIS — J0140 Acute pansinusitis, unspecified: Secondary | ICD-10-CM | POA: Diagnosis not present

## 2020-06-01 DIAGNOSIS — F17210 Nicotine dependence, cigarettes, uncomplicated: Secondary | ICD-10-CM | POA: Diagnosis not present

## 2020-06-01 DIAGNOSIS — R519 Headache, unspecified: Secondary | ICD-10-CM | POA: Diagnosis present

## 2020-06-01 MED ORDER — AMOXICILLIN-POT CLAVULANATE 875-125 MG PO TABS: 1.0000 | ORAL_TABLET | Freq: Two times a day (BID) | ORAL | 0 refills | Status: AC

## 2020-06-01 MED ORDER — HYDROCODONE-ACETAMINOPHEN 5-325 MG PO TABS: 1.0000 | ORAL_TABLET | Freq: Four times a day (QID) | ORAL | 0 refills | Status: AC | PRN

## 2020-06-01 MED ORDER — PREDNISONE 10 MG PO TABS: ORAL_TABLET | ORAL | 0 refills | Status: AC

## 2020-06-01 NOTE — ED Notes (Signed)
Called pt phone, no answer. Cannot find pt

## 2020-06-01 NOTE — Discharge Instructions (Addendum)
Pick up your medicine at your pharmacy. Do not take the pain medication while driving or operating machinery. You need to take the antibiotic until completely finished. There is also a prescription for prednisone which should help with your sinus pain and reduce swelling. Follow-up with Dr. Andee Poles at [facility] if any continued problems.

## 2020-06-01 NOTE — ED Triage Notes (Addendum)
Pt ambulatory to triage. Pt tearful   Pt has swelling and facial pain for 5 days.  Recent sinus infection.  cig smoker  No resp distress.  Pt has sinus congestion   Pt alert.

## 2020-06-01 NOTE — ED Provider Notes (Signed)
[facility] Emergency Department Provider Note   ____________________________________________   First MD Initiated Contact with Patient 06/01/20 0805     (approximate)  I have reviewed the triage vital signs and the nursing notes.   HISTORY  Chief Complaint Facial Swelling and Facial Pain    HPI Faith Price is a 49 y.o. female presents to the ED with complaint of facial pain and swelling for last 5 days.  Patient states that she was treated several months ago with steroids but no antibiotic for her sinuses.  Patient continues to smoke cigarettes daily.  She is unaware of any fever and denies chills.  There is been no cough.  There is no known Covid exposure and she denies fever, chills, nausea or vomiting.  There is also been no change in taste but smell is altered due to her nasal congestion.  Patient is also a smoker.  Currently she rates her pain as a 10/10.       No past medical history on file.  There are no problems to display for this patient.   No past surgical history on file.  Prior to Admission medications   Medication Sig Start Date End Date Taking? Authorizing Provider  amoxicillin-clavulanate (AUGMENTIN) 875-125 MG tablet Take 1 tablet by mouth 2 (two) times daily for 10 days. 06/01/20 06/11/20  Tommi Rumps, PA-C  Citalopram Hydrobromide (CELEXA PO) Take by mouth.    [provider]  HYDROcodone-acetaminophen (NORCO/VICODIN) 5-325 MG tablet Take 1 tablet by mouth every 6 (six) hours as needed for moderate pain. 06/01/20   Tommi Rumps, PA-C  predniSONE (DELTASONE) 10 MG tablet Take 6 tablets  today, on day 2 take 5 tablets, day 3 take 4 tablets, day 4 take 3 tablets, day 5 take  2 tablets and 1 tablet the last day 06/01/20   Tommi Rumps, PA-C    Allergies Patient has no known allergies.  No family history on file.  Social History Social History   Tobacco Use  . Smoking status: Current Every Day Smoker   Packs/day: 2.00    Types: Cigarettes  . Smokeless tobacco: Never Used  Substance Use Topics  . Alcohol use: No  . Drug use: No    Review of Systems Constitutional: No fever/chills Eyes: No visual changes. ENT: No sore throat.  Positive for sinus pressure.  Has of dental pain. Cardiovascular: Denies chest pain. Respiratory: Denies shortness of breath. Gastrointestinal:  No nausea, no vomiting.   Musculoskeletal: Positive for muscle aches. Skin: Negative for rash. Neurological: Negative for headaches, focal weakness or numbness. ____________________________________________   PHYSICAL EXAM:  VITAL SIGNS: ED Triage Vitals  Enc Vitals Group     BP 06/01/20 0121 139/88     Pulse Rate 06/01/20 0121 (!) 120     Resp 06/01/20 0121 18     Temp 06/01/20 0121 99 F (37.2 C)     Temp Source 06/01/20 0121 Oral     SpO2 06/01/20 0121 98 %     Weight 06/01/20 0123 190 lb (86.2 kg)     Height 06/01/20 0123 5\' 6"  (1.676 m)     Head Circumference --      Peak Flow --      Pain Score 06/01/20 0122 10     Pain Loc --      Pain Edu? --      Excl. in GC? --     Constitutional: Alert and oriented. Well appearing and in no  acute distress. Eyes: Conjunctivae are normal.  Head: Atraumatic. Nose: Mild congestion/rhinnorhea.  TMs are dull bilaterally but no erythema or injection noted.  Moderate tenderness on percussion of the maxillary sinuses bilaterally. Mouth/Throat: Mucous membranes are moist.  Oropharynx non-erythematous. Neck: No stridor.   Hematological/Lymphatic/Immunilogical: No cervical lymphadenopathy. Cardiovascular: Normal rate, regular rhythm. Grossly normal heart sounds.  Good peripheral circulation. Respiratory: Normal respiratory effort.  No retractions. Lungs CTAB. Musculoskeletal: Moves upper and lower extremities no difficulty normal gait was noted. Neurologic:  Normal speech and language. No gross focal neurologic deficits are appreciated. No gait instability. Skin:   Skin is warm, dry and intact. No rash noted. Psychiatric: Mood and affect are normal. Speech and behavior are normal.  ____________________________________________   LABS (all labs ordered are listed, but only abnormal results are displayed)  Labs Reviewed - No data to display  PROCEDURES  Procedure(s) performed (including Critical Care):  Procedures   ____________________________________________   INITIAL IMPRESSION / ASSESSMENT AND PLAN / ED COURSE  As part of my medical decision making, I reviewed the following data within the electronic MEDICAL RECORD NUMBER Notes from prior ED visits and [place] Controlled Substance Database   49 year old female presents to the ED with complaint of facial pain and swelling for the last 5 days.  Patient states that she gets sinus infections and that several months ago she was treated with steroids but no antibiotics.  Patient is extremely tearful states her face teeth hurt.  She is moderately tender to percussion bilateral maxillary sinuses.  Patient was given a prescription for Augmentin 875 twice daily for 10 days along with prednisone taper and Norco if needed for pain.  She is to follow-up with her PCP or [facility] if any continued problems or not improving.  ____________________________________________   FINAL CLINICAL IMPRESSION(S) / ED DIAGNOSES  Final diagnoses:  Acute pansinusitis, recurrence not specified     ED Discharge Orders         Ordered    amoxicillin-clavulanate (AUGMENTIN) 875-125 MG tablet  2 times daily     06/01/20 0925    predniSONE (DELTASONE) 10 MG tablet     06/01/20 0925    HYDROcodone-acetaminophen (NORCO/VICODIN) 5-325 MG tablet  Every 6 hours PRN     06/01/20 4580           Note:  This document was prepared using Dragon voice recognition software and may include unintentional dictation errors.    Johnn Hai, PA-C 06/01/20 1125    Lavonia Drafts, MD 06/01/20 [phone]
